# Patient Record
Sex: Male | Born: 1935 | Race: White | Hispanic: No | Marital: Married
Health system: Southern US, Community
[De-identification: ages and names within clinical notes are randomized; demographics above are authoritative.]

---

## 2013-04-05 ENCOUNTER — Emergency Department: Payer: Self-pay | Admitting: Emergency Medicine

## 2013-04-05 ENCOUNTER — Ambulatory Visit (HOSPITAL_COMMUNITY)
Admission: AD | Admit: 2013-04-05 | Discharge: 2013-04-05 | Disposition: A | Discharge status: Home / Self Care | Payer: Medicare Other | Source: Other Acute Inpatient Hospital | Attending: Emergency Medicine | Admitting: Emergency Medicine

## 2013-04-05 DIAGNOSIS — R58 Hemorrhage, not elsewhere classified: Secondary | ICD-10-CM | POA: Insufficient documentation

## 2013-04-05 LAB — COMPREHENSIVE METABOLIC PANEL
ALT: 18 U/L (ref 12–78)
ANION GAP: 11 (ref 7–16)
Albumin: 3.1 g/dL — ABNORMAL LOW (ref 3.4–5.0)
Alkaline Phosphatase: 96 U/L
BUN: 27 mg/dL — AB (ref 7–18)
Bilirubin,Total: 0.5 mg/dL (ref 0.2–1.0)
CALCIUM: 8.3 mg/dL — AB (ref 8.5–10.1)
Chloride: 101 mmol/L (ref 98–107)
Co2: 26 mmol/L (ref 21–32)
Creatinine: 5.3 mg/dL — ABNORMAL HIGH (ref 0.60–1.30)
EGFR (African American): 10 — ABNORMAL LOW
EGFR (Non-African Amer.): 9 — ABNORMAL LOW
Glucose: 135 mg/dL — ABNORMAL HIGH (ref 65–99)
Osmolality: 283 (ref 275–301)
POTASSIUM: 4 mmol/L (ref 3.5–5.1)
SGOT(AST): 19 U/L (ref 15–37)
Sodium: 138 mmol/L (ref 136–145)
Total Protein: 5.6 g/dL — ABNORMAL LOW (ref 6.4–8.2)

## 2013-04-05 LAB — CBC WITH DIFFERENTIAL/PLATELET
Basophil #: 0 10*3/uL (ref 0.0–0.1)
Basophil %: 0.4 %
EOS ABS: 0.1 10*3/uL (ref 0.0–0.7)
Eosinophil %: 1.2 %
HCT: 33.9 % — ABNORMAL LOW (ref 40.0–52.0)
HGB: 10.9 g/dL — ABNORMAL LOW (ref 13.0–18.0)
LYMPHS ABS: 3.1 10*3/uL (ref 1.0–3.6)
LYMPHS PCT: 49.7 %
MCH: 31.6 pg (ref 26.0–34.0)
MCHC: 32.2 g/dL (ref 32.0–36.0)
MCV: 98 fL (ref 80–100)
Monocyte #: 0.6 x10 3/mm (ref 0.2–1.0)
Monocyte %: 9.8 %
NEUTROS PCT: 38.9 %
Neutrophil #: 2.4 10*3/uL (ref 1.4–6.5)
Platelet: 55 10*3/uL — ABNORMAL LOW (ref 150–440)
RBC: 3.46 10*6/uL — ABNORMAL LOW (ref 4.40–5.90)
RDW: 18.9 % — ABNORMAL HIGH (ref 11.5–14.5)
WBC: 6.2 10*3/uL (ref 3.8–10.6)

## 2013-04-05 LAB — PROTIME-INR
INR: 1
Prothrombin Time: 13.4 secs (ref 11.5–14.7)

## 2013-05-09 ENCOUNTER — Observation Stay: Payer: Self-pay | Admitting: Internal Medicine

## 2013-05-09 LAB — COMPREHENSIVE METABOLIC PANEL
ALT: 11 U/L — AB (ref 12–78)
Albumin: 2.4 g/dL — ABNORMAL LOW (ref 3.4–5.0)
Alkaline Phosphatase: 107 U/L
Anion Gap: 8 (ref 7–16)
BUN: 30 mg/dL — AB (ref 7–18)
Bilirubin,Total: 0.7 mg/dL (ref 0.2–1.0)
CALCIUM: 8.7 mg/dL (ref 8.5–10.1)
CHLORIDE: 103 mmol/L (ref 98–107)
CREATININE: 7.08 mg/dL — AB (ref 0.60–1.30)
Co2: 27 mmol/L (ref 21–32)
EGFR (African American): 7 — ABNORMAL LOW
GFR CALC NON AF AMER: 6 — AB
GLUCOSE: 95 mg/dL (ref 65–99)
Osmolality: 282 (ref 275–301)
POTASSIUM: 4.4 mmol/L (ref 3.5–5.1)
SGOT(AST): 19 U/L (ref 15–37)
Sodium: 138 mmol/L (ref 136–145)
TOTAL PROTEIN: 6.3 g/dL — AB (ref 6.4–8.2)

## 2013-05-09 LAB — PROTIME-INR
INR: 1
PROTHROMBIN TIME: 13.3 s (ref 11.5–14.7)

## 2013-05-09 LAB — TROPONIN I
TROPONIN-I: 0.17 ng/mL — AB
Troponin-I: 0.16 ng/mL — ABNORMAL HIGH
Troponin-I: 0.17 ng/mL — ABNORMAL HIGH

## 2013-05-09 LAB — CK TOTAL AND CKMB (NOT AT ARMC)
CK, Total: 23 U/L — ABNORMAL LOW
CK-MB: 0.7 ng/mL (ref 0.5–3.6)

## 2013-05-09 LAB — CBC
HCT: 32.1 % — ABNORMAL LOW (ref 40.0–52.0)
HGB: 10.3 g/dL — ABNORMAL LOW (ref 13.0–18.0)
MCH: 30.7 pg (ref 26.0–34.0)
MCHC: 32.2 g/dL (ref 32.0–36.0)
MCV: 95 fL (ref 80–100)
Platelet: 106 10*3/uL — ABNORMAL LOW (ref 150–440)
RBC: 3.36 10*6/uL — ABNORMAL LOW (ref 4.40–5.90)
RDW: 18.7 % — ABNORMAL HIGH (ref 11.5–14.5)
WBC: 6.2 10*3/uL (ref 3.8–10.6)

## 2013-05-09 LAB — CK-MB
CK-MB: 0.8 ng/mL (ref 0.5–3.6)
CK-MB: 0.8 ng/mL (ref 0.5–3.6)

## 2013-05-09 LAB — APTT: ACTIVATED PTT: 33.6 s (ref 23.6–35.9)

## 2013-05-10 LAB — BASIC METABOLIC PANEL
Anion Gap: 6 — ABNORMAL LOW (ref 7–16)
BUN: 41 mg/dL — ABNORMAL HIGH (ref 7–18)
Calcium, Total: 8.7 mg/dL (ref 8.5–10.1)
Chloride: 103 mmol/L (ref 98–107)
Co2: 30 mmol/L (ref 21–32)
Creatinine: 8.42 mg/dL — ABNORMAL HIGH (ref 0.60–1.30)
EGFR (African American): 6 — ABNORMAL LOW
EGFR (Non-African Amer.): 5 — ABNORMAL LOW
Glucose: 78 mg/dL (ref 65–99)
OSMOLALITY: 287 (ref 275–301)
POTASSIUM: 4.7 mmol/L (ref 3.5–5.1)
Sodium: 139 mmol/L (ref 136–145)

## 2013-05-10 LAB — LIPID PANEL
CHOLESTEROL: 100 mg/dL (ref 0–200)
HDL: 31 mg/dL — AB (ref 40–60)
Ldl Cholesterol, Calc: 50 mg/dL (ref 0–100)
Triglycerides: 95 mg/dL (ref 0–200)
VLDL Cholesterol, Calc: 19 mg/dL (ref 5–40)

## 2013-05-10 LAB — TSH: THYROID STIMULATING HORM: 1.71 u[IU]/mL

## 2013-05-10 LAB — PHOSPHORUS: Phosphorus: 4.1 mg/dL (ref 2.5–4.9)

## 2013-05-11 LAB — PHOSPHORUS: PHOSPHORUS: 3 mg/dL (ref 2.5–4.9)

## 2013-05-11 LAB — PLATELET COUNT: Platelet: 68 10*3/uL — ABNORMAL LOW (ref 150–440)

## 2013-05-11 LAB — HEMOGLOBIN: HGB: 9.7 g/dL — ABNORMAL LOW (ref 13.0–18.0)

## 2013-12-31 DEATH — deceased

## 2014-06-23 NOTE — H&P (Signed)
PATIENT NAME:  Jacob Bowman, Jacob Bowman MR#:  [id number] DATE OF BIRTH:  [birth year]  DATE OF ADMISSION:  05/09/2013  PRIMARY CARE PROVIDER: Dr. Lucretia Roers at [facility]: Dr. Lenise Arena  CHIEF COMPLAINT: Syncope.   HISTORY OF PRESENT ILLNESS: The patient is an 81 year old white male with history of end-stage renal disease, history of intracranial hemorrhage in February who is basically receiving rehab at [facility]. He has had a history of having multiple falls. He reports that he was on an exercise bike today and passed out for a few seconds. The patient was brought to the ED and work-up has been negative including a CT scan of the head. His troponin is borderline elevated at 0.16. The patient otherwise denies any fevers, chills. No nausea, vomiting, chest pain or shortness of breath. He has been recently started on hemodialysis, which he has been doing for the past few months.   PAST MEDICAL HISTORY:  1.  End-stage renal disease, hemodialysis Tuesday, Thursday and Saturday. 2.  Hypertension but not on any antihypertensives.  3.  Intracranial bleed recently. Was transferred to [facility]. Conservatively treated. Is receiving rehab.  4.  Multiple myeloma diagnosed in November 2014. Has been on chemo as outpatient.  5.  History of bladder cancer.   PAST SURGICAL HISTORY: Status post right HD dialysis placement on the chest.   ALLERGIES: HYDROCODONE, LEVAQUIN, OXYCODONE.   CURRENT MEDICATIONS: Acyclovir 200 daily, Compazine 10 q. 6 p.r.n., Flonase 50 mcg daily, fluocinolone topical spray to affected area b.i.d. as needed, liquid tears preservation ophthalmic solution 1 drop to each eye 3 times a day, midodrine 2.5 mg 1 tab p.o. t.i.d., Neurontin 200 at bedtime, PhosLo 1 cap t.i.d., PreserVision 2 caps daily, Protonix 40 daily, Remeron 15 mg daily, Robaxin 500 mg 2 tabs 3 times a day as needed, tramadol 50 q. 4 p.r.n. for pain, Tylenol 1 tab p.o. t.i.d. as needed,  vitamin B complex 100 one tab p.o. daily, Zofran 8 mg 1 tab p.o. b.i.d. as needed.   SOCIAL HISTORY: Does not smoke. Does not drink. No drugs.   FAMILY HISTORY: Positive for hypertension.   REVIEW OF SYSTEMS: CONSTITUTIONAL: Denies any fevers. Complains of weakness, fatigue. No pain. No weight loss. No weight gain.  EYES: No blurred or double vision. No pain. No redness. No inflammation.  ENT: No tinnitus. No ear pain. No hearing loss. No seasonal or year-round allergies. No epistaxis. No discharge. No difficulty swallowing.  RESPIRATORY: Denies any cough, wheezing. No hemoptysis. No dyspnea.  CARDIOVASCULAR: Denies any chest pain, orthopnea, or edema. No arrhythmias.  GASTROINTESTINAL: No nausea, vomiting, diarrhea. No abdominal pain. No hematemesis. No melena.  GENITOURINARY: Denies any dysuria, hematuria, renal calculus, or frequency.  ENDOCRINE: Denies any polyuria, nocturia, or thyroid problems.  HEMATOLOGIC AND LYMPHATIC: Denies anemia, easy bruisability, or bleeding.  SKIN: No acne. No rash. No changes in mole, hair or skin.  MUSCULOSKELETAL: Pain related to osteoarthritis.  NEUROLOGIC: No numbness. No TIA. No seizures. History of intracerebral hemorrhage.  PSYCHIATRIC: No anxiety, ADD, or OCD.   PHYSICAL EXAMINATION: VITAL SIGNS: Temperature 98.4, pulse 82, respirations 18, blood pressure 178/93.  GENERAL: The patient is an elderly male in no acute distress.  HEENT: Head atraumatic, normocephalic. Pupils equally round and reactive to light and accommodation. There is no conjunctival pallor. No scleral icterus. Nasal exam shows no drainage or ulceration. Oropharynx is clear without any exudate.  NECK: Supple without any JVD.  HEART:  Regular rate and rhythm. No murmurs,  rubs, clicks, or gallops. PMI is not displaced.  LUNGS: Clear to auscultation bilaterally without any rales, rhonchi, or wheezing.  ABDOMEN: Soft, nontender, nondistended. Positive bowel sounds x4.  EXTREMITIES: No  clubbing, cyanosis, or edema.  SKIN: No rash.  LYMPHATICS: No lymph nodes palpable. VASCULAR: Good DP and PT pulses.  PSYCHIATRIC: Not anxious or depressed.  NEUROLOGIC: Awake, alert, and oriented x3. No focal deficits.   LABORATORY AND DIAGNOSTICS: Glucose 95, BUN 30, creatinine 7.08, sodium 138, potassium 4.4, chloride 103, CO2 27, calcium 8.7. LFTs: Total protein 6.3, albumin 2.4, bili total 0.7. ALT 11. CPK 23. CK-MB 0.7. Troponin 0.16. WBC 6.2, hemoglobin 10.3, platelet count 106,000. INR 1.   CT scan of the head shows no acute intracranial abnormality, generalized atrophy.  EKG: Normal sinus rhythm with right bundle-branch block.   Chest x-ray shows no edema, no consolidation.   ASSESSMENT AND PLAN: The patient is an 81 year old white male who is receiving rehab after intracranial hemorrhage, presents with syncope.  1.  Syncope, possibly vasovagal, could be cardiogenic. At this time, we will place the patient under observation, bet an echocardiogram of the heart, and place him on telemetry. Check carotid Dopplers. We will also check orthostatic blood pressure.  2.  Elevated troponin, likely due to his chronic renal failure and demand ischemia. Monitor cardiac enzymes. Hold aspirin in light of recent intracranial hemorrhage.  3.  Hypertension. Blood pressure elevated. I am going to start him on low-dose metoprolol.  4.  End-stage renal disease. Nephrology to see.  5.  Multiple myeloma. Outpatient follow-up with primary oncologist.  6.  Miscellaneous. We will use SCDs for deep vein thrombosis prophylaxis in light of recent intracranial hemorrhage.   CODE STATUS: DNR. The patient has a yellow form with him.   TIME SPENT: 50 minutes.  ____________________________ Lafonda Mosses Posey Pronto, MD shp:sb D: 05/09/2013 13:26:07 ET T: 05/09/2013 13:38:21 ET JOB#: [id number]  cc: Jennyfer Nickolson H. Posey Pronto, MD, <Dictator> Alric Seton MD ELECTRONICALLY SIGNED 05/17/2013 8:22

## 2014-06-23 NOTE — Consult Note (Signed)
PATIENT NAME:  ALLIE, GERHOLD MR#:  [id number] DATE OF BIRTH:  Aug 25, 1923  DATE OF CONSULTATION:  05/09/2013  CONSULTING PHYSICIAN:  Dionisio David, MD  INDICATION FOR CONSULTATION: Syncope.   HISTORY OF PRESENT ILLNESS: This is an 81 year old white male with a past medical history of end-stage renal disease, history of intracranial hemorrhage, who was getting rehab at [facility] and apparently was brought to the Emergency Room because he passed out. His CT was negative. Carotid Dopplers were negative. Thus, I was asked to evaluate the patient. He is feeling much better. He is more alert. No chest pain or shortness of breath.   PAST MEDICAL HISTORY: History of end-stage renal disease, hypertension, multiple myeloma, history of bladder cancer.   ALLERGIES: HYDROCODONE, LEVAQUIN, OXYCODONE.   SOCIAL HISTORY: No history of EtOH abuse or drinking.   FAMILY HISTORY: Positive for hypertension.   PHYSICAL EXAMINATION: GENERAL: He is alert and oriented, in no acute distress right now.  VITAL SIGNS: Blood pressure is 164/89. Respirations are 18. Pulse is 84. Temperature 98.1. Saturation 94.  NECK: No JVD.  LUNGS: Good air entry.  HEART: Regular rate and rhythm. Normal S1, S2. No audible murmur.  ABDOMEN: Soft, nontender, positive bowel sounds.  EXTREMITIES: No pedal edema.  NEUROLOGIC: He is alert and oriented.   EKG shows sinus rhythm, 81 beats per minute, right bundle branch block, nonspecific ST-T changes. Troponin is 0.17. CT of the head is negative. Chest x-ray: No edema. BUN 30; creatinine is 7.08. White count is 6.2; hemoglobin is 10.3; platelets are not given. INR is 1.0, PT 13.3.   ASSESSMENT AND PLAN: The patient had syncope. 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 This could be cardiogenic. Advise monitoring the patient on the monitor. Just mildly elevated troponin, but because of renal  failure and history of recent intracranial bleed, reluctant to do aggressive work-up such as cardiac catheterization. Advise nephrology to see the patient. The patient probably needs to be started on dialysis. Will continue to watch the patient with you for any arrhythmia.  Thank you very much for the referral.    ____________________________ Dionisio David, MD sak:jcm D: 05/09/2013 15:27:00 ET T: 05/09/2013 15:53:41 ET JOB#: [id number]  cc: Dionisio David, MD, <Dictator> Dionisio David MD ELECTRONICALLY SIGNED 05/15/2013 12:14

## 2014-06-23 NOTE — Discharge Summary (Signed)
PATIENT NAME:  Jacob Bowman, Jacob Bowman MR#:  [id number] DATE OF BIRTH:  [birth year]  DATE OF ADMISSION:  05/09/2013 DATE OF DISCHARGE:  05/11/2013  DISCHARGE DIAGNOSES: 1.  Vasovagal syncope.  2.  End-stage renal disease, on hemodialysis.  3.  Uncontrolled hypertension.  4.  Multiple myeloma.  5.  Generalized deconditioning. 6.   resolving intraventricular hemorrhage.  DISCHARGE MEDICATIONS: 1.  Acyclovir 200 mg p.o. daily. 2.  Neurontin 100 mg p.o. 2 capsules at bedtime.  3.  PreserVision antioxidant capsules 2 capsules once a day. 4.  Protonix 40 mg p.o. daily.  5.  Remeron 15 mg daily.  6.  Vitamin B complex 1 tablet daily.  7.  Midodrine 2.5 mg p.o. t.i.d.  8.  PhosLo 667 mg p.o. t.i.d.  9.   10.  Robaxin 500 mg p.o. 2 tablets p.o. t.i.d.  11.  Flonase nasal spray 1 spray in each nostril daily.  12.  Compazine 10 mg every 6 hours as needed for anxiety.  13.  Tramadol 50 mg every 4 hours as needed for pain. 14.  Zofran 8 mg p.o. b.i.d. as needed.  15.  Metoprolol tartrate 25 mg p.o. b.i.d.; this is a new medication.  DISCHARGE DIET: Renal.  CONSULTATIONS: Nephrology and also cardiology. Nephrology consult is with Dr. Candiss Norse and cardiology consult is with Dr. Neoma Laming.  DISCHARGE INSTRUCTIONS: The patient will follow up with dialysis on Saturday for his regular hemodialysis. Also see Dr. Jennings Books next week in the clinic regarding his involuntary movement of the hands.  HOSPITAL COURSE: An 81 year old male patient who came in because of syncope. The patient is at [facility] and had been working on an exercise bike and passed out for a few seconds. Did not have any seizure. Did not have chest pain. The patient's blood pressure on admission was 178/93 and heart rate 82. Troponin was slightly up at 0.16. The patient admitted to observation status for syncope evaluation. The patient did not have any orthostatic changes in blood pressure. The patient had an echocardiogram along with  carotid ultrasound. The patient missed dialysis on Tuesday when he came. The patient had a BUN of 30 and creatinine 7 on admission. The patient missed his dialysis on Tuesday so he had dialysis yesterday, that is Wednesday, and today as well.  His routine dialysis is today, so he did have 2 dialysis sessions. His syncope is thought to be due to vasovagal. His troponins are a little bit up at 0.16. Seen by Dr. Neoma Laming in consultation. He thought it is likely due to renal failure, did not have any further cardiac work-up. The patient did not have chest pain. His echo showed EF of 70% to 75% with normal LV systolic function and had mild diastolic filling pattern defect. The patient's other diagnoses includes uncontrolled hypertension. The patient is not on any blood pressure medications, in fact he is on midodrine to help his blood pressure, but blood pressure has been running high at 194/98 here and heart rate was in 80s so we started him on metoprolol. The patient right now is on metoprolol at 25 mg p.o. b.i.d. The patient's blood pressure is at 146/90 and heart rate 86.   The patient's other diagnoses include multiple myeloma. The patient is supposed to get chemotherapy at [facility], but because he is really weak, needing rehabilitation, wife said that when he gets a little better than are going to continue the chemotherapy at [facility]   For history of ESRD, due to multiple  myeloma, he is on dialysis. He gets regular dialysis on Tuesday, Thursday, and Saturday. He received extra dialysis Wednesday here because he missed his routine dialysis on Tuesday.  For history of recent interventricular hemorrhage, the patient was seen in the Emergency Room on February 4th. At that time, the patient had a fall and sustained hemorrhage into the right lateral ventricle, about 5 cm x 1 cm. The patient was transferred to [place] at that time, but did not have any intervention. Right now when he was admitted we did a CT head,  repeated. Head CT showed resolving intraventricular hemorrhage, no hydrocephalus, but the patient continued to have this involuntary movement of hands whenever he wanted to use. He does have inability to stop the movements and there are incoordination hand movements. I spoke with Dr. Jennings Books who recommended that he will see him in the office next week and he said it is probably due to sequela of his hemorrhage. The patient's wife said that his hand movements are a little more coordinated today than yesterday and she thinks the dialysis helped. The patient's wife was given Dr. Guinevere Scarlet phone number. I spoke with Dr. Jennings Books. He is going to see the patient next week. The patient will be going back to [facility] to resume his physical therapy, and he will continue his hemodialysis on Saturday.   TIME SPENT ON DISCHARGE PREPARATION: 30 minutes.   ____________________________ Epifanio Lesches, MD sk:sb D: 05/11/2013 14:16:44 ET T: 05/11/2013 14:48:53 ET JOB#: [id number]  cc: Epifanio Lesches, MD, <Dictator> Epifanio Lesches MD ELECTRONICALLY SIGNED 05/15/2013 12:15

## 2014-07-20 IMAGING — CR DG CHEST 1V PORT
1 series · 1 of 1 positions shown · non-contrast
Comparison: None.

CLINICAL DATA: Chest pain

EXAM:
PORTABLE CHEST - 1 VIEW

[ap]
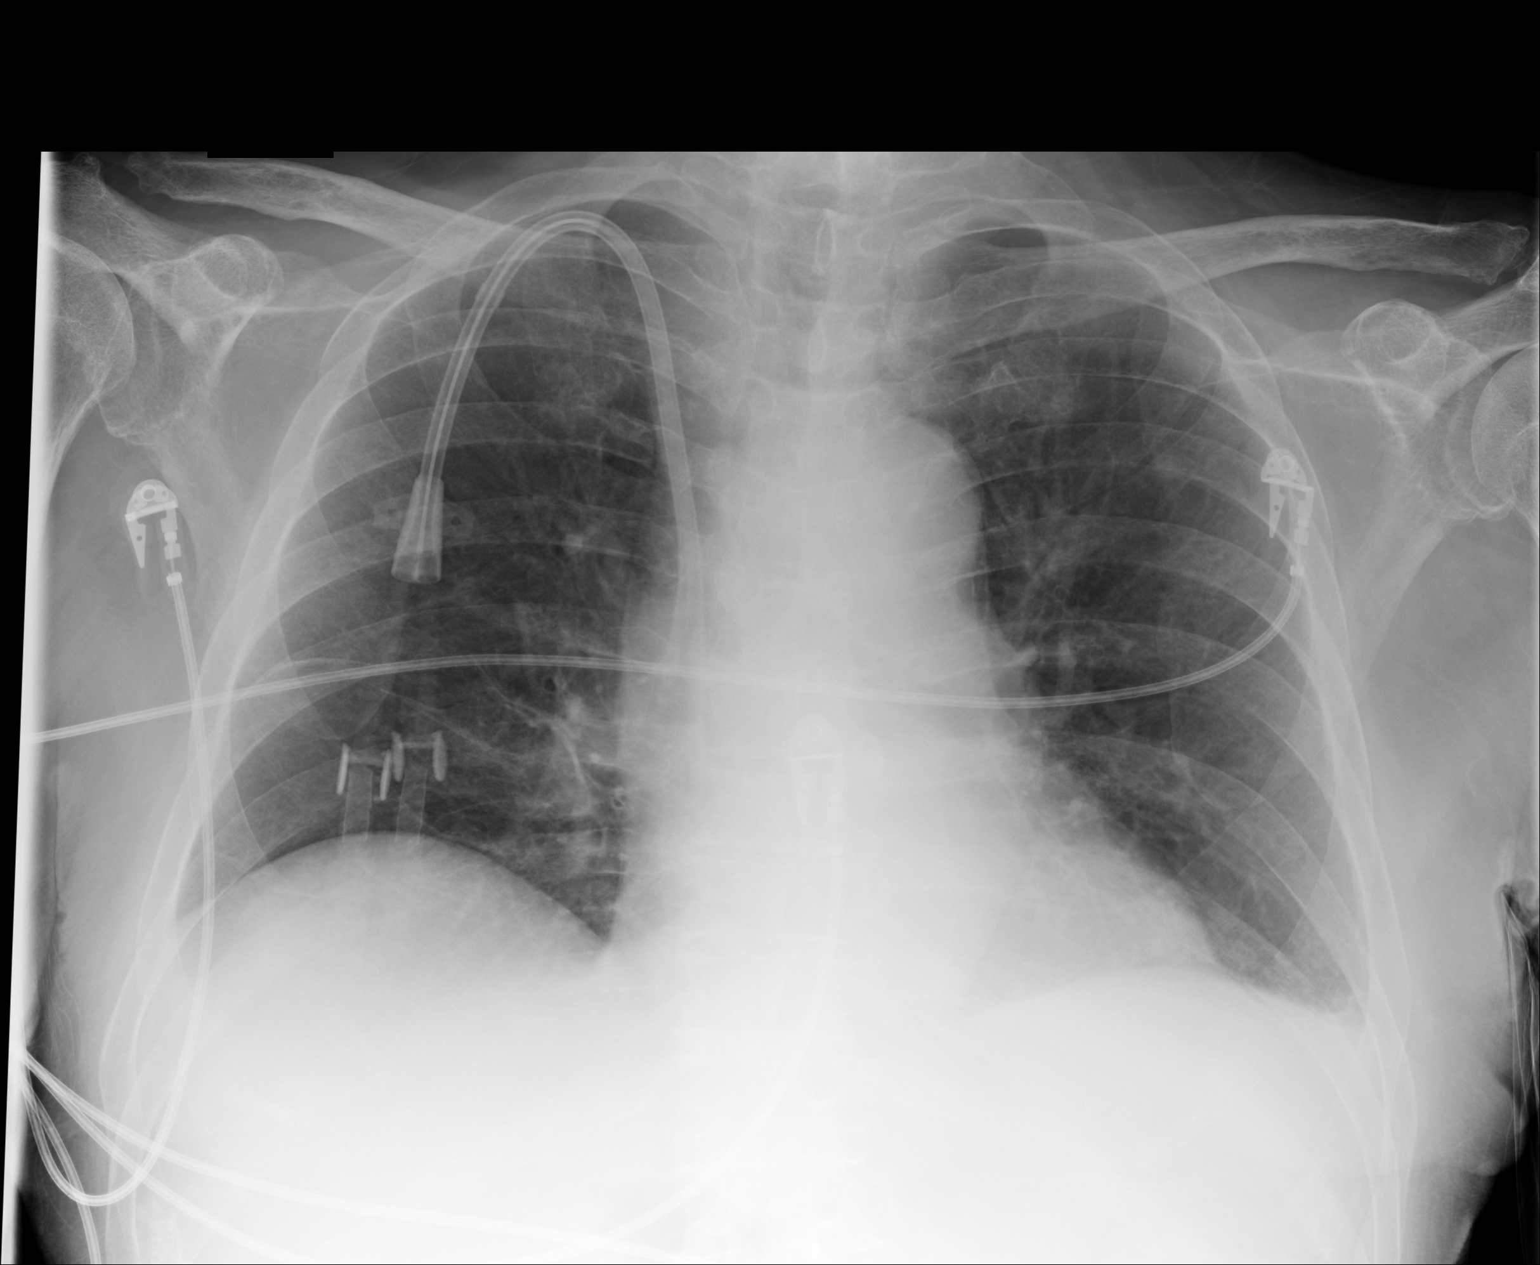

[1 of 1 positions shown; findings below may reference images not displayed]

FINDINGS: Central catheter tip is at the cavoatrial junction. No pneumothorax.
There is no edema or consolidation. Heart size and pulmonary
vascularity are normal. No adenopathy. No bone lesions.
IMPRESSION: No edema or consolidation. Central catheter tip at cavoatrial
junction. No apparent pneumothorax.

## 2014-07-20 IMAGING — CT CT HEAD WITHOUT CONTRAST
1 series · 16 of 30 positions shown, 20 images · non-contrast
Comparison: 04/05/2013

CLINICAL DATA: Syncope

EXAM:
CT HEAD WITHOUT CONTRAST
TECHNIQUE: Contiguous axial images were obtained from the base of the skull
through the vertex without intravenous contrast.

[Series 2: head wo · axial · 0.39mm/px · z∈[-72,+54]mm · 16 of 32 slices shown, 20 images]
[im 2/32  brain]
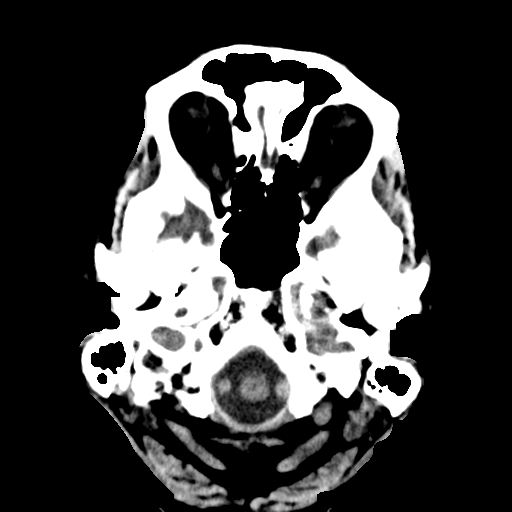
[im 2/32  bone]
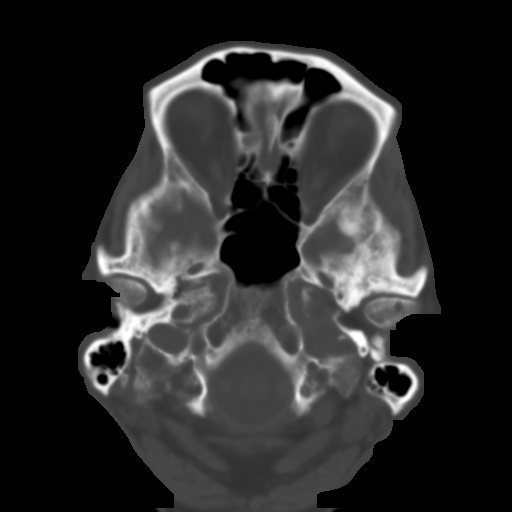
[im 4/32  brain]
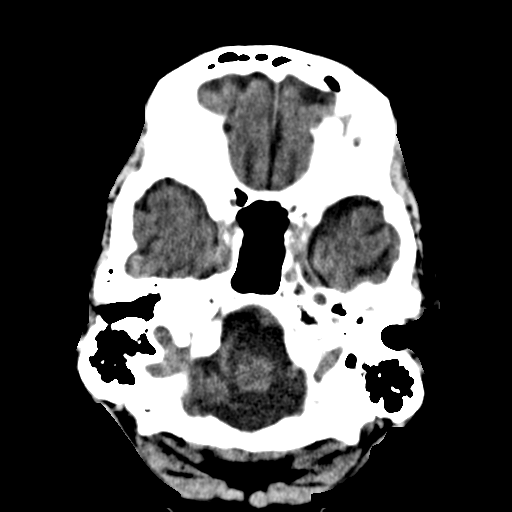
[im 6/32  brain]
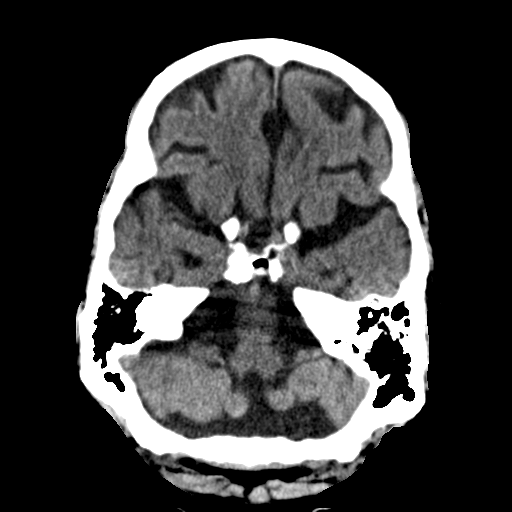
[im 8/32  brain]
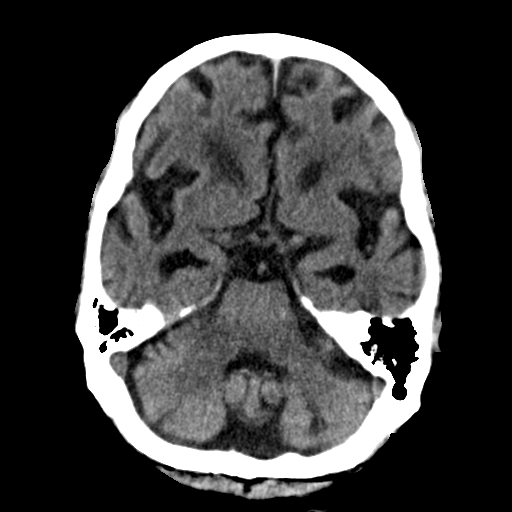
[im 9/32  brain]
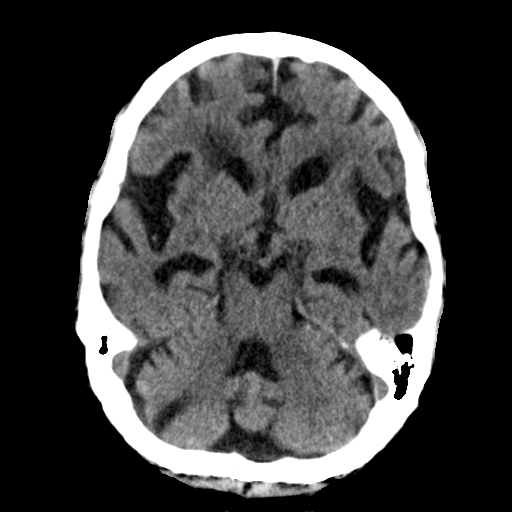
[im 9/32  bone]
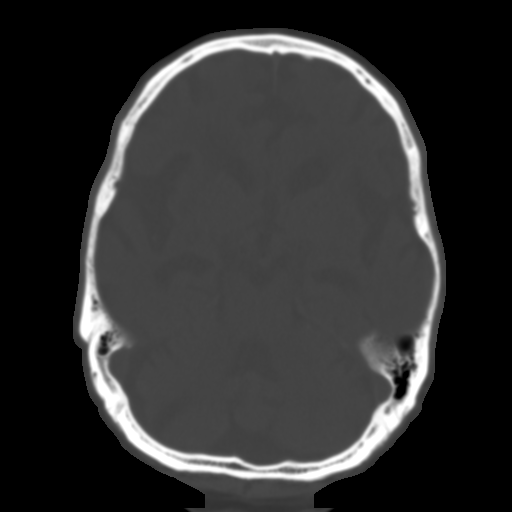
[im 11/32  brain]
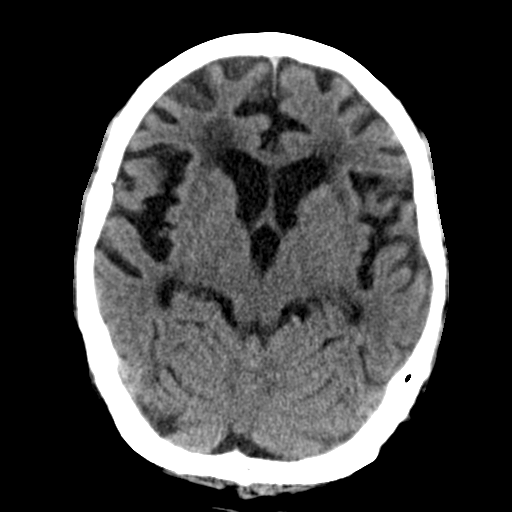
[im 13/32  brain]
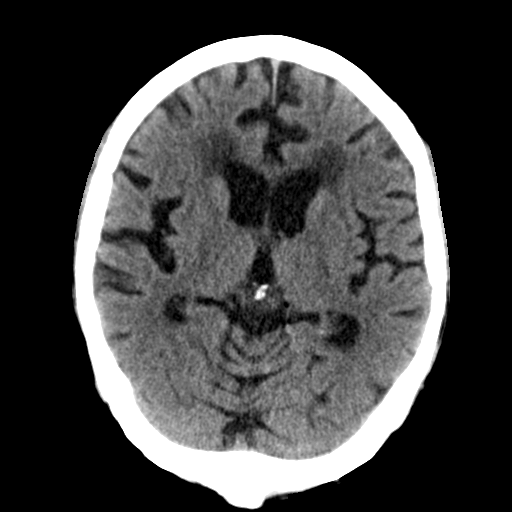
[im 15/32  brain]
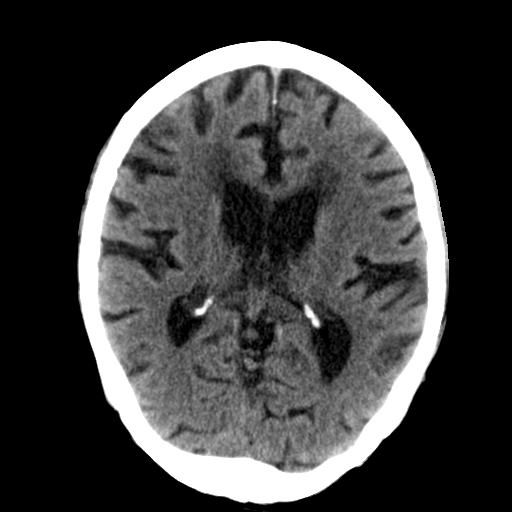
[im 17/32  brain]
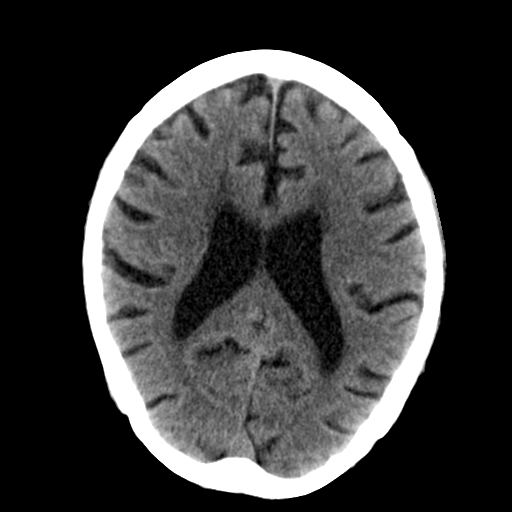
[im 17/32  bone]
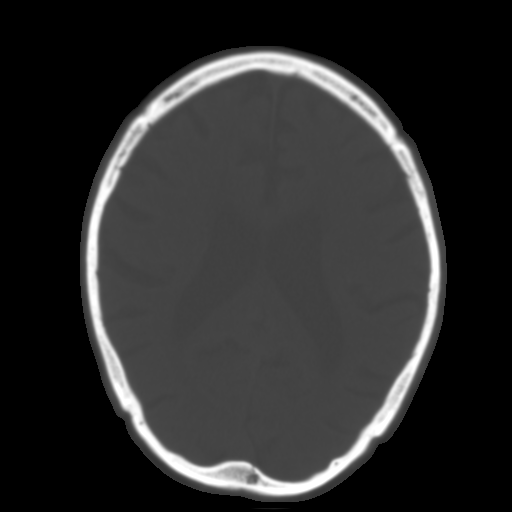
[im 19/32  brain]
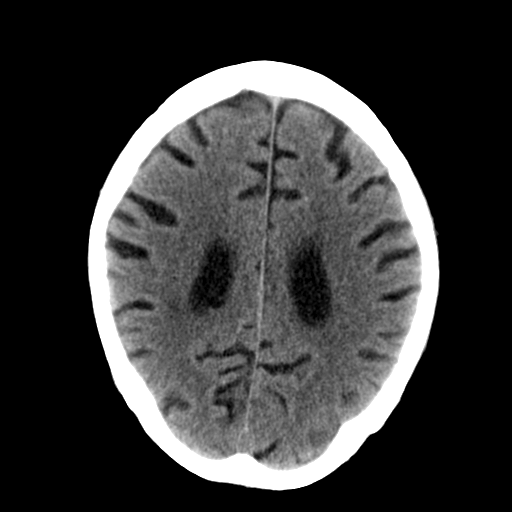
[im 21/32  brain]
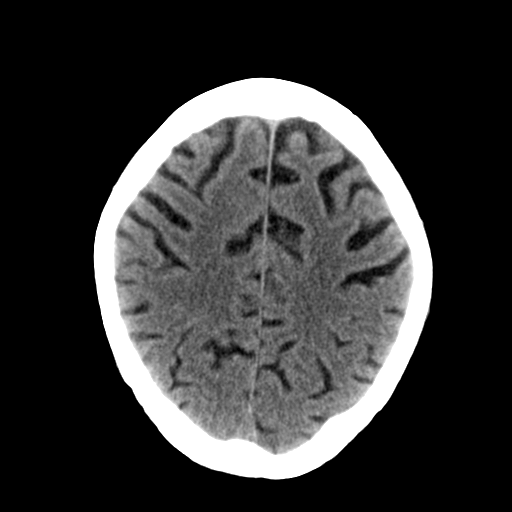
[im 23/32  brain]
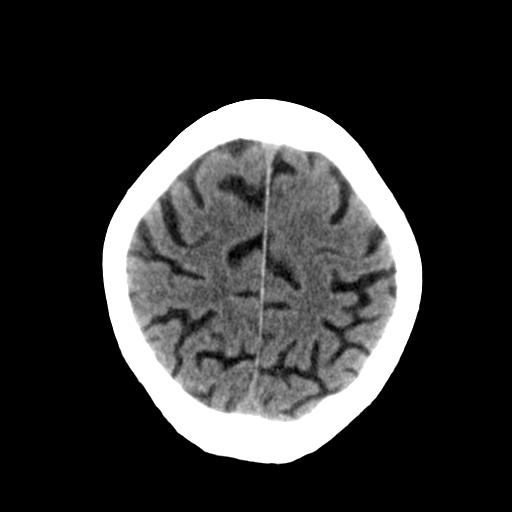
[im 24/32  brain]
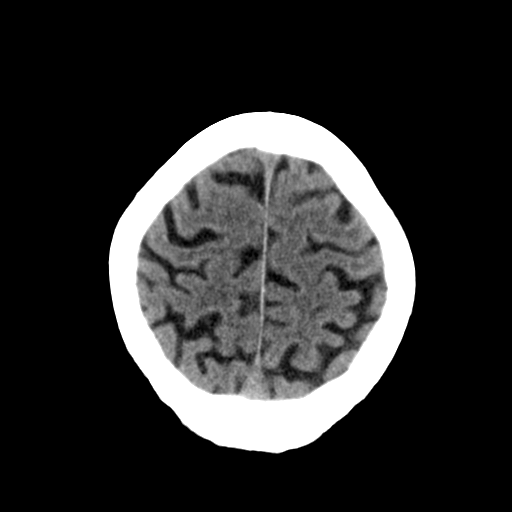
[im 24/32  bone]
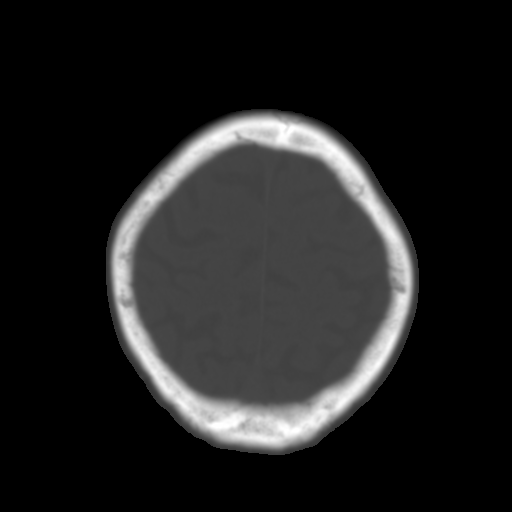
[im 26/32  brain]
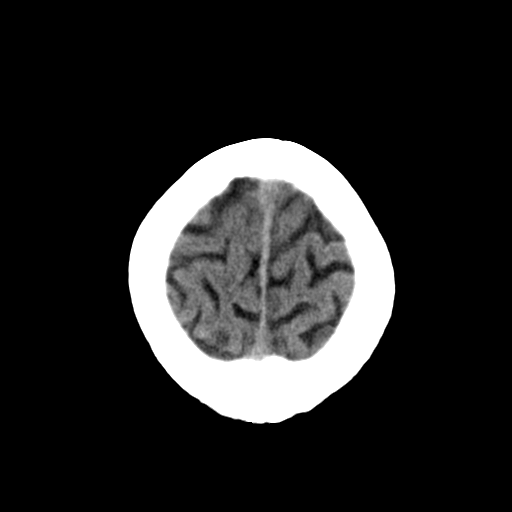
[im 28/32  brain]
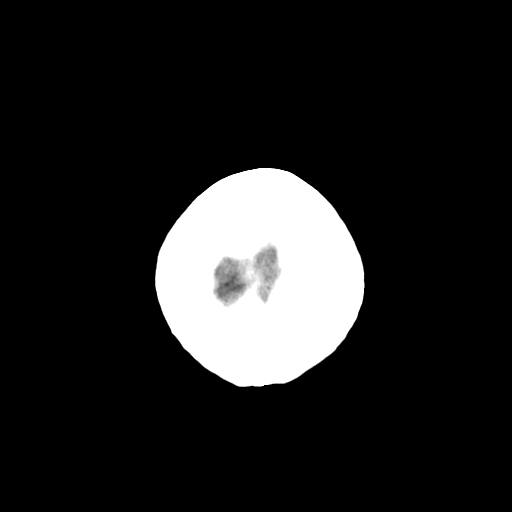
[im 30/32  brain]
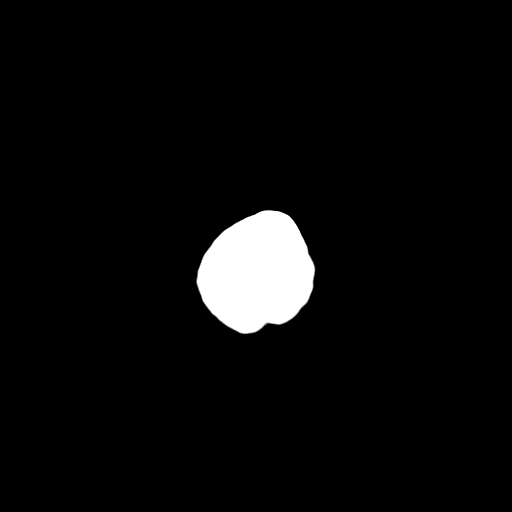

[16 of 30 positions shown; findings below may reference images not displayed]

FINDINGS: Skull and Sinuses:No significant abnormality.

Orbits: No acute abnormality.

Brain: No evidence of acute abnormality, such as acute infarction,
hemorrhage, hydrocephalus, or mass lesion/mass effect. Remote
bilateral peripheral cerebellar infarcts. Chronic small vessel
ischemic injury with white matter disease confluent around the
lateral ventricles. Remote lacunar infarct in the anterior limb left
internal capsule. Resolved intraventricular hemorrhage. No
hydrocephalus.
IMPRESSION: 1. No acute intracranial injury.
2. Resolved intraventricular hemorrhage (noted 04/05/2013). No
hydrocephalus.
3. Generalized brain atrophy and remote ischemic injury.
